# Patient Record
Sex: Female | Born: 1955 | Race: White | Hispanic: No | Marital: Single | Smoking: Former smoker
Health system: Southern US, Community
[De-identification: ages and names within clinical notes are randomized; demographics above are authoritative.]

## PROBLEM LIST (undated history)

## (undated) DIAGNOSIS — G620 Drug-induced polyneuropathy: Secondary | ICD-10-CM

## (undated) DIAGNOSIS — E079 Disorder of thyroid, unspecified: Secondary | ICD-10-CM

## (undated) DIAGNOSIS — K5792 Diverticulitis of intestine, part unspecified, without perforation or abscess without bleeding: Secondary | ICD-10-CM

## (undated) DIAGNOSIS — E039 Hypothyroidism, unspecified: Secondary | ICD-10-CM

## (undated) DIAGNOSIS — C801 Malignant (primary) neoplasm, unspecified: Secondary | ICD-10-CM

## (undated) DIAGNOSIS — C50919 Malignant neoplasm of unspecified site of unspecified female breast: Secondary | ICD-10-CM

## (undated) DIAGNOSIS — A879 Viral meningitis, unspecified: Secondary | ICD-10-CM

## (undated) DIAGNOSIS — K760 Fatty (change of) liver, not elsewhere classified: Secondary | ICD-10-CM

## (undated) DIAGNOSIS — G4730 Sleep apnea, unspecified: Secondary | ICD-10-CM

## (undated) DIAGNOSIS — E785 Hyperlipidemia, unspecified: Secondary | ICD-10-CM

## (undated) DIAGNOSIS — T451X5A Adverse effect of antineoplastic and immunosuppressive drugs, initial encounter: Secondary | ICD-10-CM

## (undated) HISTORY — PX: APPENDECTOMY: SHX54

## (undated) HISTORY — PX: ABDOMINAL HYSTERECTOMY: SHX81

## (undated) HISTORY — PX: NASAL SEPTUM SURGERY: SHX37

---

## 2015-02-24 ENCOUNTER — Emergency Department (HOSPITAL_COMMUNITY): Payer: 59

## 2015-02-24 ENCOUNTER — Emergency Department (HOSPITAL_COMMUNITY)
Admission: EM | Admit: 2015-02-24 | Discharge: 2015-02-24 | Disposition: A | Discharge status: Home / Self Care | Payer: 59 | Attending: Emergency Medicine | Admitting: Emergency Medicine

## 2015-02-24 ENCOUNTER — Encounter (HOSPITAL_COMMUNITY): Payer: Self-pay | Admitting: Emergency Medicine

## 2015-02-24 DIAGNOSIS — Z9049 Acquired absence of other specified parts of digestive tract: Secondary | ICD-10-CM | POA: Diagnosis not present

## 2015-02-24 DIAGNOSIS — Z853 Personal history of malignant neoplasm of breast: Secondary | ICD-10-CM | POA: Insufficient documentation

## 2015-02-24 DIAGNOSIS — Z8669 Personal history of other diseases of the nervous system and sense organs: Secondary | ICD-10-CM | POA: Diagnosis not present

## 2015-02-24 DIAGNOSIS — R110 Nausea: Secondary | ICD-10-CM

## 2015-02-24 DIAGNOSIS — Z87891 Personal history of nicotine dependence: Secondary | ICD-10-CM | POA: Diagnosis not present

## 2015-02-24 DIAGNOSIS — Z8661 Personal history of infections of the central nervous system: Secondary | ICD-10-CM | POA: Diagnosis not present

## 2015-02-24 DIAGNOSIS — K5792 Diverticulitis of intestine, part unspecified, without perforation or abscess without bleeding: Secondary | ICD-10-CM | POA: Diagnosis not present

## 2015-02-24 DIAGNOSIS — R740 Nonspecific elevation of levels of transaminase and lactic acid dehydrogenase [LDH]: Secondary | ICD-10-CM | POA: Insufficient documentation

## 2015-02-24 DIAGNOSIS — Z90710 Acquired absence of both cervix and uterus: Secondary | ICD-10-CM | POA: Diagnosis not present

## 2015-02-24 DIAGNOSIS — Z8639 Personal history of other endocrine, nutritional and metabolic disease: Secondary | ICD-10-CM | POA: Diagnosis not present

## 2015-02-24 DIAGNOSIS — R140 Abdominal distension (gaseous): Secondary | ICD-10-CM

## 2015-02-24 DIAGNOSIS — I890 Lymphedema, not elsewhere classified: Secondary | ICD-10-CM | POA: Insufficient documentation

## 2015-02-24 DIAGNOSIS — R1032 Left lower quadrant pain: Secondary | ICD-10-CM | POA: Diagnosis present

## 2015-02-24 DIAGNOSIS — R109 Unspecified abdominal pain: Secondary | ICD-10-CM

## 2015-02-24 DIAGNOSIS — R7401 Elevation of levels of liver transaminase levels: Secondary | ICD-10-CM

## 2015-02-24 HISTORY — DX: Malignant neoplasm of unspecified site of unspecified female breast: C50.919

## 2015-02-24 HISTORY — DX: Disorder of thyroid, unspecified: E07.9

## 2015-02-24 HISTORY — DX: Sleep apnea, unspecified: G47.30

## 2015-02-24 HISTORY — DX: Diverticulitis of intestine, part unspecified, without perforation or abscess without bleeding: K57.92

## 2015-02-24 HISTORY — DX: Adverse effect of antineoplastic and immunosuppressive drugs, initial encounter: T45.1X5A

## 2015-02-24 HISTORY — DX: Hypothyroidism, unspecified: E03.9

## 2015-02-24 HISTORY — DX: Malignant (primary) neoplasm, unspecified: C80.1

## 2015-02-24 HISTORY — DX: Fatty (change of) liver, not elsewhere classified: K76.0

## 2015-02-24 HISTORY — DX: Drug-induced polyneuropathy: G62.0

## 2015-02-24 HISTORY — DX: Viral meningitis, unspecified: A87.9

## 2015-02-24 HISTORY — DX: Hyperlipidemia, unspecified: E78.5

## 2015-02-24 LAB — URINALYSIS, ROUTINE W REFLEX MICROSCOPIC
Bilirubin Urine: NEGATIVE
GLUCOSE, UA: NEGATIVE mg/dL
HGB URINE DIPSTICK: NEGATIVE
Ketones, ur: NEGATIVE mg/dL
Leukocytes, UA: NEGATIVE
NITRITE: NEGATIVE
Protein, ur: NEGATIVE mg/dL
SPECIFIC GRAVITY, URINE: 1.012 (ref 1.005–1.030)
Urobilinogen, UA: 2 mg/dL — ABNORMAL HIGH (ref 0.0–1.0)
pH: 6 (ref 5.0–8.0)

## 2015-02-24 LAB — COMPREHENSIVE METABOLIC PANEL
ALT: 87 U/L — ABNORMAL HIGH (ref 14–54)
ANION GAP: 11 (ref 5–15)
AST: 52 U/L — AB (ref 15–41)
Albumin: 3.6 g/dL (ref 3.5–5.0)
Alkaline Phosphatase: 71 U/L (ref 38–126)
BUN: 7 mg/dL (ref 6–20)
CO2: 25 mmol/L (ref 22–32)
Calcium: 9.1 mg/dL (ref 8.9–10.3)
Chloride: 102 mmol/L (ref 101–111)
Creatinine, Ser: 0.62 mg/dL (ref 0.44–1.00)
GFR calc non Af Amer: >60 mL/min (ref >60)
Glucose, Bld: 120 mg/dL — ABNORMAL HIGH (ref 65–99)
Potassium: 3.4 mmol/L — ABNORMAL LOW (ref 3.5–5.1)
SODIUM: 138 mmol/L (ref 135–145)
Total Bilirubin: 1 mg/dL (ref 0.3–1.2)
Total Protein: 6.8 g/dL (ref 6.5–8.1)

## 2015-02-24 LAB — CBC WITH DIFFERENTIAL/PLATELET
BASOS PCT: 0 % (ref 0–1)
Basophils Absolute: 0 10*3/uL (ref 0.0–0.1)
Eosinophils Absolute: 0.2 10*3/uL (ref 0.0–0.7)
Eosinophils Relative: 2 % (ref 0–5)
HEMATOCRIT: 37.6 % (ref 36.0–46.0)
Hemoglobin: 12.8 g/dL (ref 12.0–15.0)
Lymphocytes Relative: 16 % (ref 12–46)
Lymphs Abs: 2 10*3/uL (ref 0.7–4.0)
MCH: 30.8 pg (ref 26.0–34.0)
MCHC: 34 g/dL (ref 30.0–36.0)
MCV: 90.6 fL (ref 78.0–100.0)
MONO ABS: 1.2 10*3/uL — AB (ref 0.1–1.0)
Monocytes Relative: 9 % (ref 3–12)
Neutro Abs: 9.4 10*3/uL — ABNORMAL HIGH (ref 1.7–7.7)
Neutrophils Relative %: 73 % (ref 43–77)
PLATELETS: 256 10*3/uL (ref 150–400)
RBC: 4.15 MIL/uL (ref 3.87–5.11)
RDW: 13.8 % (ref 11.5–15.5)
WBC: 12.8 10*3/uL — ABNORMAL HIGH (ref 4.0–10.5)

## 2015-02-24 LAB — LIPASE, BLOOD: Lipase: 25 U/L (ref 22–51)

## 2015-02-24 MED ORDER — IOHEXOL 300 MG/ML  SOLN
100.0000 mL | Freq: Once | INTRAMUSCULAR | Status: AC | PRN
  Administered 2015-02-24: 100 mL via INTRAVENOUS

## 2015-02-24 MED ORDER — IOHEXOL 300 MG/ML  SOLN
25.0000 mL | Freq: Once | INTRAMUSCULAR | Status: AC | PRN
  Administered 2015-02-24: 25 mL via ORAL

## 2015-02-24 MED ORDER — ONDANSETRON HCL 8 MG PO TABS: 8.0000 mg | ORAL_TABLET | Freq: Three times a day (TID) | ORAL | Status: AC | PRN

## 2015-02-24 MED ORDER — HEPARIN SOD (PORK) LOCK FLUSH 100 UNIT/ML IV SOLN
500.0000 [IU] | INTRAVENOUS | Status: AC | PRN
  Administered 2015-02-24: 500 [IU]

## 2015-02-24 MED ORDER — AMOXICILLIN-POT CLAVULANATE 875-125 MG PO TABS: 1.0000 | ORAL_TABLET | Freq: Two times a day (BID) | ORAL | Status: AC

## 2015-02-24 MED ORDER — SODIUM CHLORIDE 0.9 % IV BOLUS (SEPSIS)
1000.0000 mL | Freq: Once | INTRAVENOUS | Status: AC
Start: 2015-02-24 — End: 2015-02-24
  Administered 2015-02-24: 1000 mL via INTRAVENOUS

## 2015-02-24 MED ORDER — MORPHINE SULFATE 4 MG/ML IJ SOLN
4.0000 mg | Freq: Once | INTRAMUSCULAR | Status: DC
  Filled 2015-02-24: qty 1

## 2015-02-24 MED ORDER — HYDROCODONE-ACETAMINOPHEN 5-325 MG PO TABS: 1.0000 | ORAL_TABLET | Freq: Four times a day (QID) | ORAL | Status: AC | PRN

## 2015-02-24 NOTE — ED Notes (Signed)
Pt from hotel with c/o lower left sharp abdominal pain that woke her from sleep at about 1130 Friday night.  Pt reports soft stools, no bleeding noted in BM, reports she's from out of town has had a changed in diet and believes she is having diverticulitis.  Pt drinking Mt Dew stating is soothes the pain.  Hx of same, cipro to treat, unable to take flagyl.  Pt in NAD, A&O.

## 2015-02-24 NOTE — ED Notes (Signed)
Pt had port place in [place].  IV team paged

## 2015-02-24 NOTE — Discharge Instructions (Signed)
Your abdominal pain is due to diverticulitis. Take augmentin as directed. Use tylenol or motrin as needed for pain, and use norco as directed as needed for severe pain. Use zofran as directed for nausea. Stay well hydrated with clear liquids, eat a bland diet of clear liquids for 2-3 days then progress diet to soft bland foods. Follow up with your regular doctor in 1 week. Return to the ER for changes or worsening symptoms.  Abdominal (belly) pain can be caused by many things. Your caregiver performed an examination and possibly ordered blood/urine tests and imaging (CT scan, x-rays, ultrasound). Many cases can be observed and treated at home after initial evaluation in the emergency department. Even though you are being discharged home, abdominal pain can be unpredictable. Therefore, you need a repeated exam if your pain does not resolve, returns, or worsens. Most patients with abdominal pain don't have to be admitted to the hospital or have surgery, but serious problems like appendicitis and gallbladder attacks can start out as nonspecific pain. Many abdominal conditions cannot be diagnosed in one visit, so follow-up evaluations are very important. SEEK IMMEDIATE MEDICAL ATTENTION IF YOU DEVELOP ANY OF THE FOLLOWING SYMPTOMS:  The pain does not go away or becomes severe.   A temperature above 101 develops.   Repeated vomiting occurs (multiple episodes).   The pain becomes localized to portions of the abdomen. The right side could possibly be appendicitis. In an adult, the left lower portion of the abdomen could be colitis or diverticulitis.   Blood is being passed in stools or vomit (bright red or black tarry stools).   Return also if you develop chest pain, difficulty breathing, dizziness or fainting, or become confused, poorly responsive, or inconsolable (young children).  The constipation stays for more than 4 days.   There is belly (abdominal) or rectal pain.   You do not seem to be  getting better.     Abdominal Pain, Women Abdominal (stomach, pelvic, or belly) pain can be caused by many things. It is important to tell your doctor:  The location of the pain.  Does it come and go or is it present all the time?  Are there things that start the pain (eating certain foods, exercise)?  Are there other symptoms associated with the pain (fever, nausea, vomiting, diarrhea)? All of this is helpful to know when trying to find the cause of the pain. CAUSES   Stomach: virus or bacteria infection, or ulcer.  Intestine: appendicitis (inflamed appendix), regional ileitis (Crohn's disease), ulcerative colitis (inflamed colon), irritable bowel syndrome, diverticulitis (inflamed diverticulum of the colon), or cancer of the stomach or intestine.  Gallbladder disease or stones in the gallbladder.  Kidney disease, kidney stones, or infection.  Pancreas infection or cancer.  Fibromyalgia (pain disorder).  Diseases of the female organs:  Uterus: fibroid (non-cancerous) tumors or infection.  Fallopian tubes: infection or tubal pregnancy.  Ovary: cysts or tumors.  Pelvic adhesions (scar tissue).  Endometriosis (uterus lining tissue growing in the pelvis and on the pelvic organs).  Pelvic congestion syndrome (female organs filling up with blood just before the menstrual period).  Pain with the menstrual period.  Pain with ovulation (producing an egg).  Pain with an IUD (intrauterine device, birth control) in the uterus.  Cancer of the female organs.  Functional pain (pain not caused by a disease, may improve without treatment).  Psychological pain.  Depression. DIAGNOSIS  Your doctor will decide the seriousness of your pain by doing an examination.  Blood tests.  X-rays.  Ultrasound.  CT scan (computed tomography, special type of X-ray).  MRI (magnetic resonance imaging).  Cultures, for infection.  Barium enema (dye inserted in the large intestine, to  better view it with X-rays).  Colonoscopy (looking in intestine with a lighted tube).  Laparoscopy (minor surgery, looking in abdomen with a lighted tube).  Major abdominal exploratory surgery (looking in abdomen with a large incision). TREATMENT  The treatment will depend on the cause of the pain.   Many cases can be observed and treated at home.  Over-the-counter medicines recommended by your caregiver.  Prescription medicine.  Antibiotics, for infection.  Birth control pills, for painful periods or for ovulation pain.  Hormone treatment, for endometriosis.  Nerve blocking injections.  Physical therapy.  Antidepressants.  Counseling with a psychologist or psychiatrist.  Minor or major surgery. HOME CARE INSTRUCTIONS   Do not take laxatives, unless directed by your caregiver.  Take over-the-counter pain medicine only if ordered by your caregiver. Do not take aspirin because it can cause an upset stomach or bleeding.  Try a clear liquid diet (broth or water) as ordered by your caregiver. Slowly move to a bland diet, as tolerated, if the pain is related to the stomach or intestine.  Have a thermometer and take your temperature several times a day, and record it.  Bed rest and sleep, if it helps the pain.  Avoid sexual intercourse, if it causes pain.  Avoid stressful situations.  Keep your follow-up appointments and tests, as your caregiver orders.  If the pain does not go away with medicine or surgery, you may try:  Acupuncture.  Relaxation exercises (yoga, meditation).  Group therapy.  Counseling. SEEK MEDICAL CARE IF:   You notice certain foods cause stomach pain.  Your home care treatment is not helping your pain.  You need stronger pain medicine.  You want your IUD removed.  You feel faint or lightheaded.  You develop nausea and vomiting.  You develop a rash.  You are having side effects or an allergy to your medicine. SEEK IMMEDIATE  MEDICAL CARE IF:   Your pain does not go away or gets worse.  You have a fever.  Your pain is felt only in portions of the abdomen. The right side could possibly be appendicitis. The left lower portion of the abdomen could be colitis or diverticulitis.  You are passing blood in your stools (bright red or black tarry stools, with or without vomiting).  You have blood in your urine.  You develop chills, with or without a fever.  You pass out. MAKE SURE YOU:   Understand these instructions.  Will watch your condition.  Will get help right away if you are not doing well or get worse. Document Released: 06/28/2007 Document Revised: 01/15/2014 Document Reviewed: 07/18/2009 [facility] Patient Information 2015 [place], [place]. This information is not intended to replace advice given to you by your health care provider. Make sure you discuss any questions you have with your health care provider.  Diverticulitis Diverticulitis is when small pockets that have formed in your colon (large intestine) become infected or swollen. HOME CARE  Follow your doctor's instructions.  Follow a special diet if told by your doctor.  When you feel better, your doctor may tell you to change your diet. You may be told to eat a lot of fiber. Fruits and vegetables are good sources of fiber. Fiber makes it easier to poop (have bowel movements).  Take supplements or probiotics as  told by your doctor.  Only take medicines as told by your doctor.  Keep all follow-up visits with your doctor. GET HELP IF:  Your pain does not get better.  You have a hard time eating food.  You are not pooping like normal. GET HELP RIGHT AWAY IF:  Your pain gets worse.  Your problems do not get better.  Your problems suddenly get worse.  You have a fever.  You keep throwing up (vomiting).  You have bloody or black, tarry poop (stool). MAKE SURE YOU:   Understand these instructions.  Will watch your  condition.  Will get help right away if you are not doing well or get worse. Document Released: 02/17/2008 Document Revised: 09/05/2013 Document Reviewed: 07/26/2013 [facility] Patient Information 2015 [place], [place]. This information is not intended to replace advice given to you by your health care provider. Make sure you discuss any questions you have with your health care provider.  Nausea, Adult Nausea is the feeling that you have an upset stomach or have to vomit. Nausea by itself is not likely a serious concern, but it may be an early sign of more serious medical problems. As nausea gets worse, it can lead to vomiting. If vomiting develops, there is the risk of dehydration.  CAUSES   Viral infections.  Food poisoning.  Medicines.  Pregnancy.  Motion sickness.  Migraine headaches.  Emotional distress.  Severe pain from any source.  Alcohol intoxication. HOME CARE INSTRUCTIONS  Get plenty of rest.  Ask your caregiver about specific rehydration instructions.  Eat small amounts of food and sip liquids more often.  Take all medicines as told by your caregiver. SEEK MEDICAL CARE IF:  You have not improved after 2 days, or you get worse.  You have a headache. SEEK IMMEDIATE MEDICAL CARE IF:   You have a fever.  You faint.  You keep vomiting or have blood in your vomit.  You are extremely weak or dehydrated.  You have dark or bloody stools.  You have severe chest or abdominal pain. MAKE SURE YOU:  Understand these instructions.  Will watch your condition.  Will get help right away if you are not doing well or get worse. Document Released: 10/08/2004 Document Revised: 05/25/2012 Document Reviewed: 05/13/2011 [facility] Patient Information 2015 [place], [place]. This information is not intended to replace advice given to you by your health care provider. Make sure you discuss any questions you have with your health care provider.

## 2015-02-24 NOTE — ED Provider Notes (Signed)
CSN: [id number]     Arrival date & time 02/24/15  7062 History   First MD Initiated Contact with Patient 02/24/15 0847     Chief Complaint  Patient presents with  . Abdominal Pain     (Consider location/radiation/quality/duration/timing/severity/associated sxs/prior Treatment) HPI Comments: Melissa Richard is a 59 y.o. 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 She states this pain is 9/10 sharp and stabbing, located in the left lower quadrant, intermittent with movement, nonradiating, worse with movement, and improved with laying on her left side. Associated symptoms include bloating, nausea initially with pain but now resolved, and inability to pass flatus today. Her last flatus was yesterday night. She denies any fevers, chills, chest pain, shortness of breath, vomiting, diarrhea, constipation, melanotic, hematochezia, dysuria, hematuria, vaginal bleeding or discharge, numbness, tingling, weakness, foreign travel, suspicious food intake, NSAID use, sick contacts, or recent antibiotics. She states she is on a business trip from [place], and has been "eating differently" since being here and she wonders whether this may have caused a flare of her diverticulitis. She drinks 4-5 drinks per week, and had 2 beers on Friday prior to onset of symptoms. She is not actively undergoing chemo, and is in remission from her breast CA. She states she had a CT in February which was "normal" aside from showing diverticulosis. She can tolerate cipro but is unable to take flagyl.  Patient is a 58 y.o. female presenting with  abdominal pain. The history is provided by the patient. No language interpreter was used.  Abdominal Pain Pain location:  LLQ Pain quality: sharp   Pain radiates to:  Does not radiate Pain severity:  Moderate Onset quality:  Sudden Duration:  36 hours Timing:  Intermittent Progression:  Waxing and waning Chronicity:  Recurrent Context: alcohol use (2 beers Fri night), diet changes and recent travel (from [place])   Context: not sick contacts and not suspicious food intake   Relieved by:  Position changes Worsened by:  Movement Ineffective treatments:  None tried Associated symptoms: flatus (unable to pass flatus today) and nausea (initially, but now resolved)   Associated symptoms: no chest pain, no chills, no constipation, no diarrhea, no dysuria, no fever, no hematemesis, no hematochezia, no hematuria, no melena, no shortness of breath, no vaginal bleeding, no vaginal discharge and no vomiting   Risk factors: multiple surgeries and obesity     No past medical history on file. No past surgical history on file. No family history on file. History  Substance Use Topics  . Smoking status: Not on file  . Smokeless tobacco: Not on file  . Alcohol Use: Not on file   OB History    No data available     Review of Systems  Constitutional: Negative for fever and chills.  Respiratory: Negative for shortness of breath.   Cardiovascular: Negative for chest pain.  Gastrointestinal: Positive for nausea (initially, but now resolved), abdominal pain and flatus (unable to pass flatus today). Negative for vomiting, diarrhea, constipation, blood in stool, melena, hematochezia and hematemesis.  Genitourinary: Negative for dysuria, hematuria, flank pain, vaginal bleeding and vaginal discharge.  Musculoskeletal: Negative for myalgias and arthralgias.  Skin: Negative for color change.  Allergic/Immunologic: Negative for immunocompromised state.  Neurological: Negative for weakness and numbness.    Psychiatric/Behavioral: Negative for confusion.   10 Systems reviewed and are negative for acute change except as noted in the HPI.    Allergies  Review of patient's allergies indicates not on file.  Home Medications   Prior to Admission medications   Not on File   BP 136/64 mmHg  Pulse 90  Temp(Src) 98.2 F (36.8 C) (Oral)  Resp 17  Ht 5\' 8"  (1.727 m)  Wt 255 lb (115.667 kg)  BMI 38.78 kg/m2  SpO2 97% Physical Exam  Constitutional: She is oriented to person, place, and time. Vital signs are normal. She appears well-developed and well-nourished.  Non-toxic appearance. No distress.  Afebrile, nontoxic, NAD  HENT:  Head: Normocephalic and atraumatic.  Mouth/Throat: Oropharynx is clear and moist and mucous membranes are normal.  Eyes: Conjunctivae and EOM are normal. Right eye exhibits no discharge. Left eye exhibits no discharge.  Neck: Normal range of motion. Neck supple.  Cardiovascular: Normal rate, regular rhythm, normal heart sounds and intact distal pulses.  Exam reveals no gallop and no friction rub.   No murmur heard. Pulmonary/Chest: Effort normal and breath sounds normal. No respiratory distress. She has no decreased breath sounds. She has no wheezes. She has no rhonchi. She has no rales.  R upper chest port without surrounding erythema  Abdominal: Soft. Normal appearance and bowel sounds are normal. She exhibits no distension. There is tenderness in the left lower quadrant. There is no rigidity, no rebound, no guarding, no CVA tenderness, no tenderness at McBurney's point and negative Murphy's sign.    Soft, obese but not overtly distended, +BS throughout, with LLQ TTP, no r/g/r, neg murphy's, neg mcburney's, no CVA TTP   Musculoskeletal: Normal range of motion.  Neurological: She is alert and oriented to person, place, and time. She has normal strength. No sensory deficit.  Skin: Skin is warm, dry and intact. No rash noted.  Psychiatric: She has a normal mood and  affect.  Nursing note and vitals reviewed.   ED Course  Procedures (including critical care time) Labs Review Labs Reviewed  CBC WITH DIFFERENTIAL/PLATELET - Abnormal; Notable for the following:    WBC 12.8 (*)    Neutro Abs 9.4 (*)    Monocytes Absolute 1.2 (*)    All other components within normal limits  COMPREHENSIVE METABOLIC PANEL - Abnormal; Notable for the following:    Potassium 3.4 (*)    Glucose, Bld 120 (*)    AST 52 (*)    ALT 87 (*)    All other components within normal limits  URINALYSIS, ROUTINE W REFLEX MICROSCOPIC (NOT AT [facility]) - Abnormal; Notable for the following:    APPearance CLOUDY (*)    Urobilinogen, UA 2.0 (*)    All other components within normal limits  LIPASE, BLOOD    Imaging Review Ct Abdomen Pelvis W Contrast  02/24/2015   CLINICAL DATA:  Left-sided abdominal pain  EXAM: CT ABDOMEN AND PELVIS WITH CONTRAST  TECHNIQUE: Multidetector CT imaging of the abdomen and pelvis was performed using the standard protocol following bolus administration of intravenous contrast.  CONTRAST:  100 mL Omnipaque 300  COMPARISON:  None.  FINDINGS: Lung bases are free of acute infiltrate or sizable effusion. Calcified granuloma is noted in the left lower lobe. Bilateral breast implants are noted.  The liver is diffusely fatty infiltrated. A lobulated area of decreased attenuation is noted  likely representing a cyst. The spleen demonstrates multiple granulomas. A knee right adrenal lesion is noted measuring approximately 1 cm likely representing small adenoma. The pancreas is within normal limits. The kidneys show no mass lesion or hydronephrosis. No renal calculi are seen.  The bladder is well distended. The uterus has been surgically removed. The appendix has been removed as well. Diverticular change of the colon is noted with evidence of diverticulitis at the junction of the descending and sigmoid colons. No abscess or perforation is seen. No acute bony abnormality is seen.   IMPRESSION: Diverticulitis.  Fatty liver with apparent cyst.  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  Prior granulomatous disease.   Electronically Signed   By: Inez Catalina M.D.   On: 02/24/2015 14:06   Dg Chest Portable 1 View  02/24/2015   CLINICAL DATA:  Port-A-Cath placement.  EXAM: PORTABLE CHEST - 1 VIEW  COMPARISON:  None.  FINDINGS: Right Port-A-Cath terminates at the low SVC. surgical clips within the left axilla and both breasts. Midline trachea. Borderline cardiomegaly. Mediastinal contours otherwise within normal limits. No pleural effusion or pneumothorax. No congestive failure. Nodular density projecting lateral to the left heart border is favored to be vascular or artifactual.  IMPRESSION: Appropriate position of right Port-A-Cath, without pneumothorax.  Cardiomegaly without congestive failure.  Nodular density projecting over the left mid lung is favored to be artifactual or possibly represent a vessel on than. Consider PA and lateral radiographs with attention to this area.   Electronically Signed   By: Abigail Miyamoto M.D.   On: 02/24/2015 11:27     EKG Interpretation None      MDM   Final diagnoses:  Abdominal pain  Abdominal bloating  Diverticulitis of intestine without perforation or abscess without bleeding  Nausea  Elevated transaminase level    59 y.o. female here with LLQ abd pain, mild nausea initially but none now. On exam, tender in LLQ, nonperitoneal, no flank pain. Pt feels bloated and hasn't passed gas since last night. Hx of cancer, therefore concern for possible obstruction as an etiology. Will obtain labs and CT abd/pelvis. Pt declines pain meds at this time since she has to drive home. Offered that if her pain became unbearable, we will arrange return ride or keep her until narcotics are out of her system. Will reassess shortly.   12:24 PM Difficulty with establishing IV access and getting labs, IV team paged and requested CXR to eval placement. CXR showing appropriate  port-a-cath placement. Also showing mild cardiomegaly and nodular density over L mid lung which is likely artifact. Awaiting labs and CT. Will continue to monitor pt.  1:17 PM CBC w/diff showing mild leukocytosis. CMP showing K 3.4, and AST/ALT elevations which is chronic per pt. Lipase WNL. U/A pending and awaiting CT. Pt now requesting pain meds, will give morphine. Will reassess shortly.   2:37 PM U/A unremarkable. CT showing diverticulitis, fatty liver, R adrenal adenoma. Will treat with augmentin for diverticulitis since pt doesn't tolerate flagyl. Will give pain meds and discussed diet of clear liquids x2-3 days then advance to soft bland foods. Will give zofran for nausea. Will have  Her f/up with PCP in 1wk. I explained the diagnosis and have given explicit precautions to return to the ER including for any other new or worsening symptoms. The patient understands and accepts the medical plan as it's been dictated and I have answered their questions. Discharge instructions concerning home care and prescriptions have been given. The patient is STABLE and  is discharged to home in good condition.  BP 143/56 mmHg  Pulse 90  Temp(Src) 98.2 F (36.8 C) (Oral)  Resp 18  Ht 5\' 8"  (1.727 m)  Wt 255 lb (115.667 kg)  BMI 38.78 kg/m2  SpO2 99%  Meds ordered this encounter  Medications  . iohexol (OMNIPAQUE) 300 MG/ML solution 25 mL    Sig:   . sodium chloride 0.9 % bolus 1,000 mL    Sig:   . morphine 4 MG/ML injection 4 mg    Sig:   . iohexol (OMNIPAQUE) 300 MG/ML solution 100 mL    Sig:   . ondansetron (ZOFRAN) 8 MG tablet    Sig: Take 1 tablet (8 mg total) by mouth every 8 (eight) hours as needed for nausea or vomiting.    Dispense:  10 tablet    Refill:  0    Order Specific Question:  Supervising Provider    Answer:  Sabra Heck, BRIAN [[id number]]  . HYDROcodone-acetaminophen (NORCO) 5-325 MG per tablet    Sig: Take 1 tablet by mouth every 6 (six) hours as needed for severe pain.    Dispense:   10 tablet    Refill:  0    Order Specific Question:  Supervising Provider    Answer:  Sabra Heck, BRIAN [[id number]]  . amoxicillin-clavulanate (AUGMENTIN) 875-125 MG per tablet    Sig: Take 1 tablet by mouth 2 (two) times daily. One po bid x 10 days    Dispense:  20 tablet    Refill:  0    Order Specific Question:  Supervising Provider    Answer:  Noemi Chapel [[id number]]     Vaughan Garfinkle Camprubi-Soms, PA-C 02/24/15 1439  Nat Christen, MD 02/24/15 1444

## 2016-07-02 IMAGING — CR DG CHEST 1V PORT
1 series · 1 of 1 positions shown · non-contrast
Comparison: None.

CLINICAL DATA: Port-A-Cath placement.

EXAM:
PORTABLE CHEST - 1 VIEW

[AP]
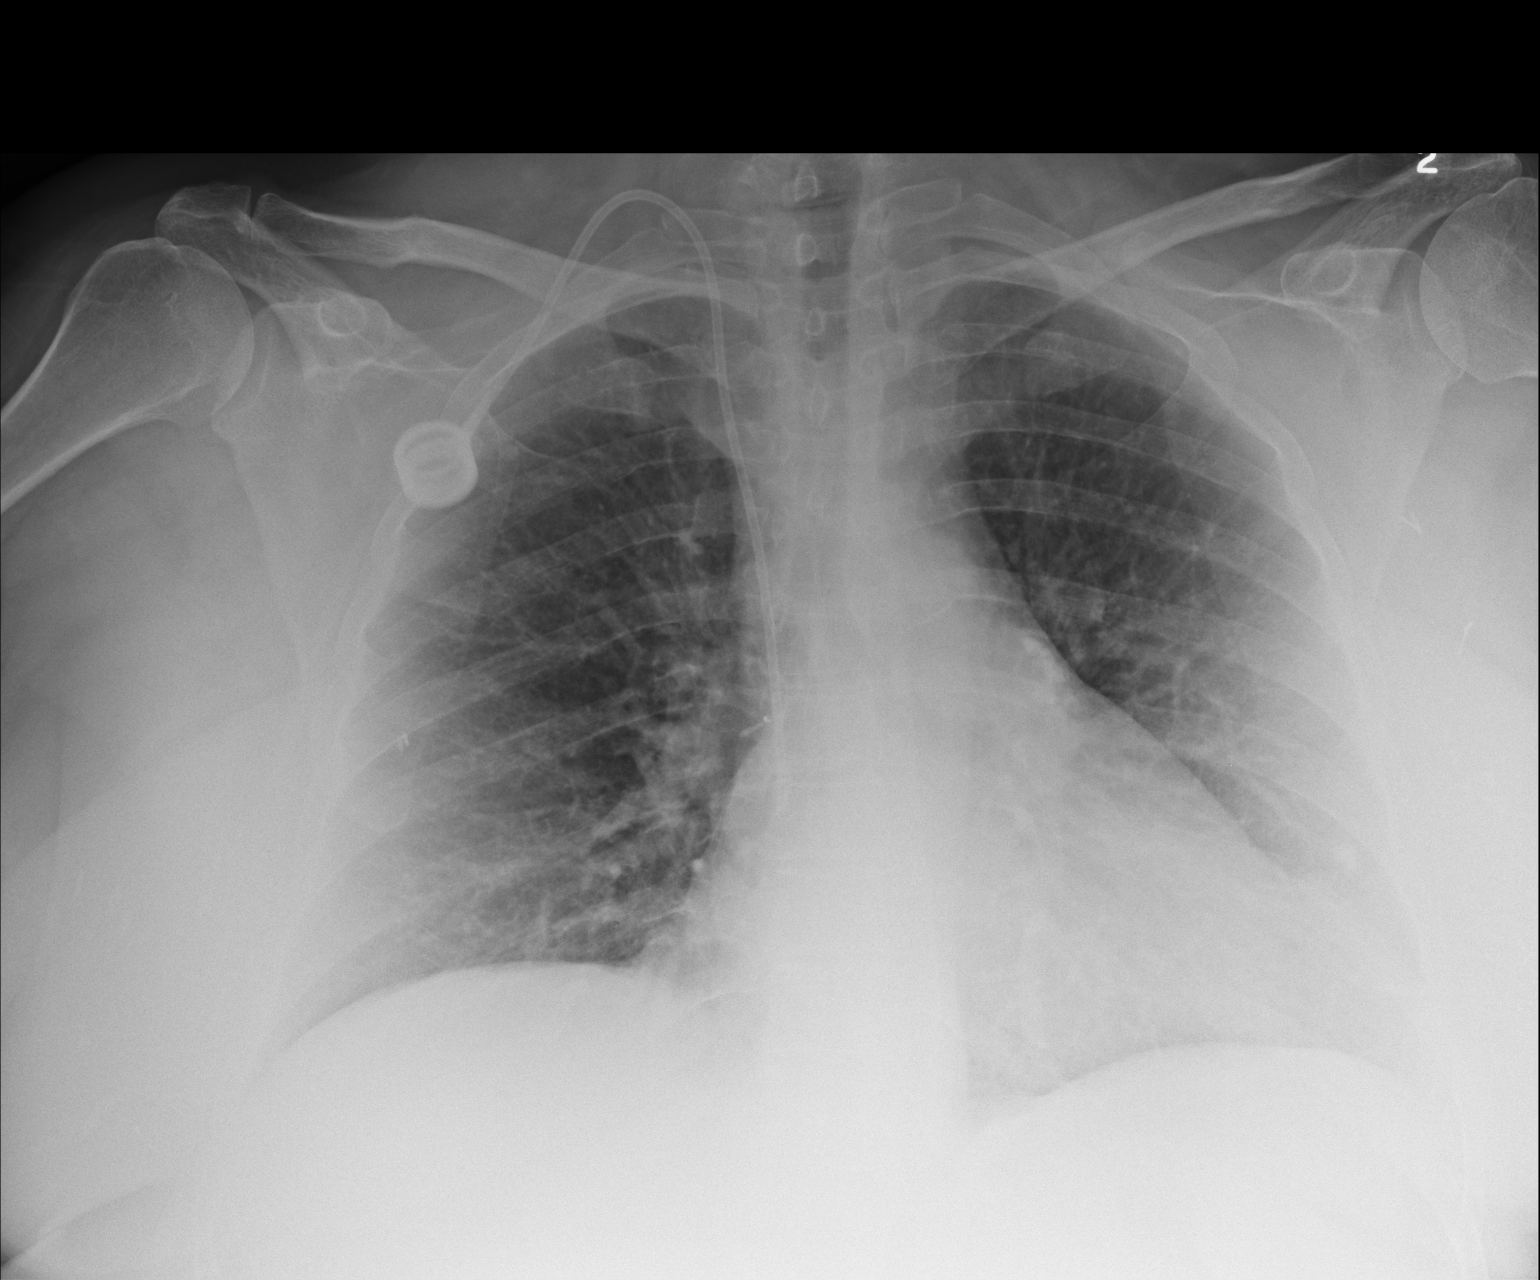

[1 of 1 positions shown; findings below may reference images not displayed]

FINDINGS: Right Port-A-Cath terminates at the low SVC. surgical clips within
the left axilla and both breasts. Midline trachea. Borderline
cardiomegaly. Mediastinal contours otherwise within normal limits.
No pleural effusion or pneumothorax. No congestive failure. Nodular
density projecting lateral to the left heart border is favored to be
vascular or artifactual.
IMPRESSION: Appropriate position of right Port-A-Cath, without pneumothorax.

Cardiomegaly without congestive failure.

Nodular density projecting over the left mid lung is favored to be
artifactual or possibly represent a vessel on than. Consider PA and
lateral radiographs with attention to this area.

## 2016-07-02 IMAGING — CT CT ABD-PELV W/ CM
2 of 5 series · 17 of 46 positions shown, 19 images · IV contrast (Omni 300)
Comparison: None.

CLINICAL DATA: Left-sided abdominal pain

EXAM:
CT ABDOMEN AND PELVIS WITH CONTRAST
TECHNIQUE: Multidetector CT imaging of the abdomen and pelvis was performed
using the standard protocol following bolus administration of
intravenous contrast.
CONTRAST:  100 mL Omnipaque 300

[Series 2: abd/ pelvis 5.0 i30f 1 · axial · 0.82mm/px · z∈[+777,+1227]mm · 14 of 102 slices shown, 16 images]
[im 6/102  soft-tissue]
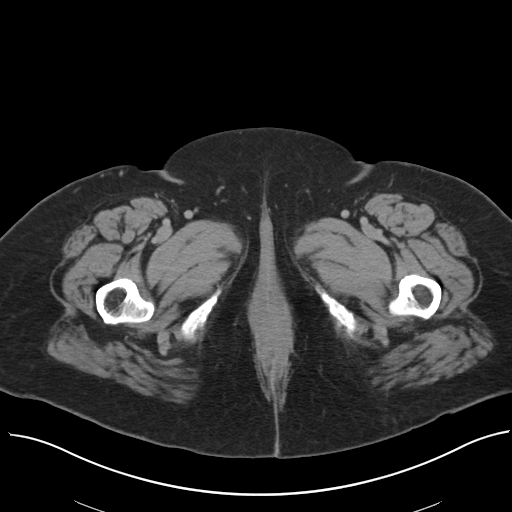
[im 6/102  bone]
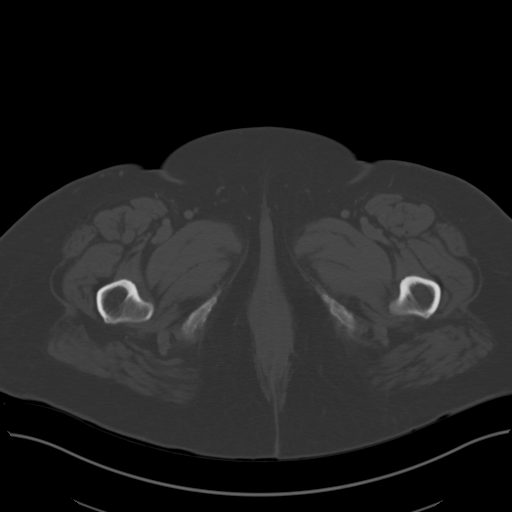
[im 11/102  soft-tissue]
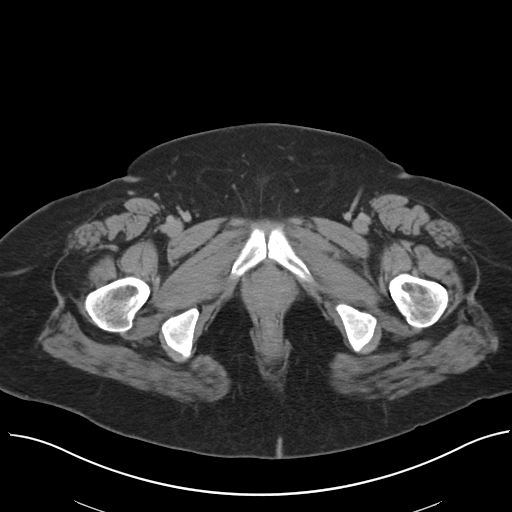
[im 22/102  soft-tissue]
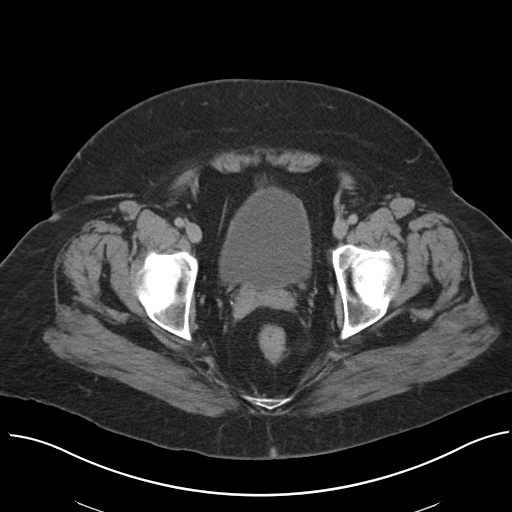
[im 27/102  soft-tissue]
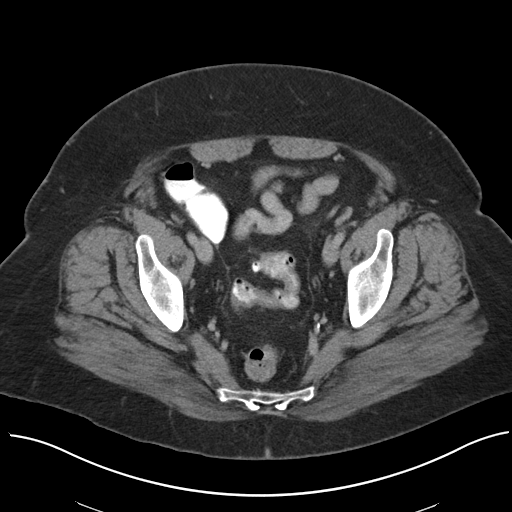
[im 32/102  soft-tissue]
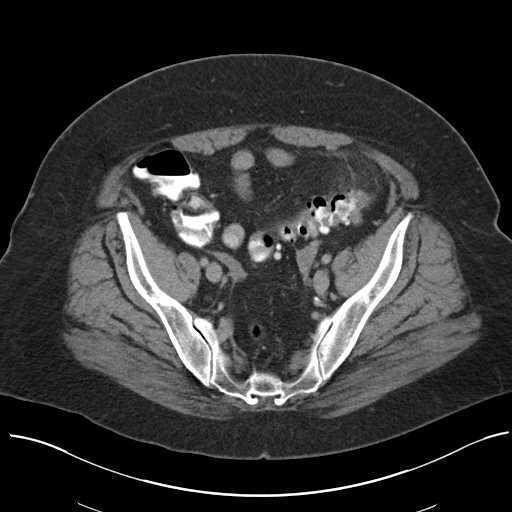
[im 43/102  soft-tissue]
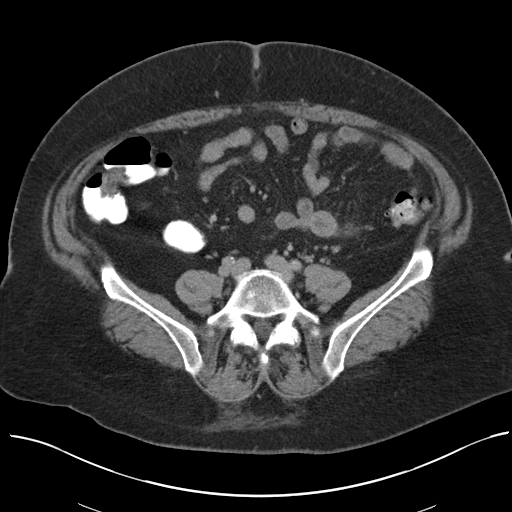
[im 48/102  soft-tissue]
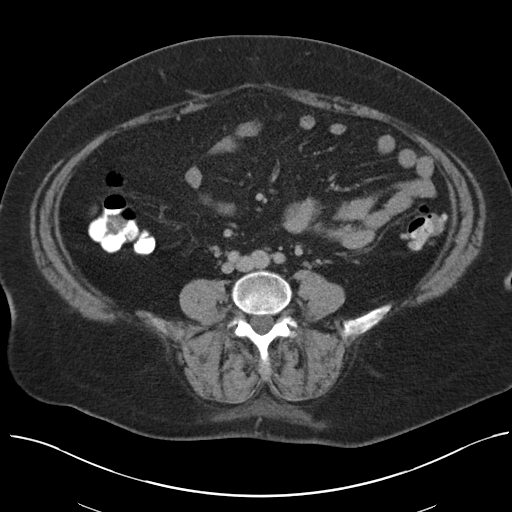
[im 54/102  soft-tissue]
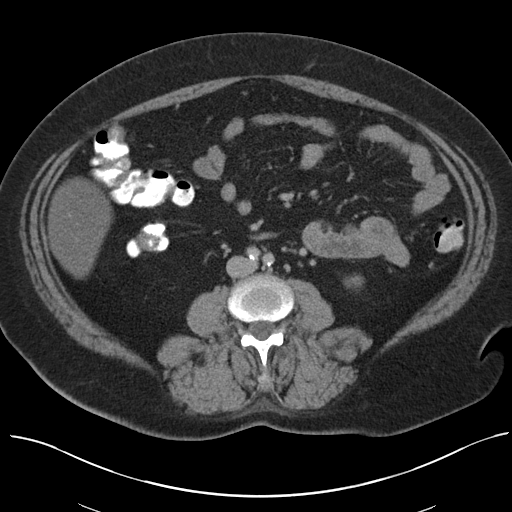
[im 59/102  soft-tissue]
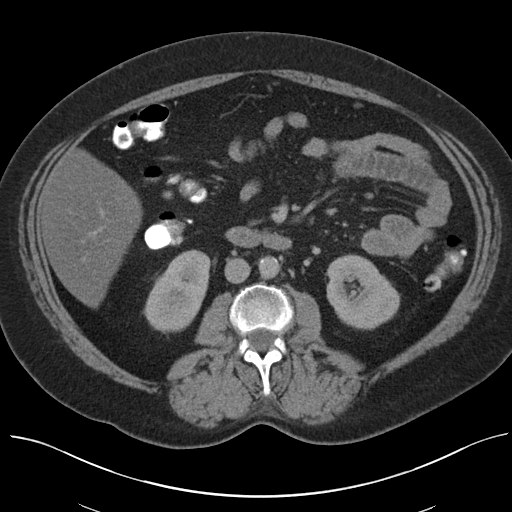
[im 59/102  bone]
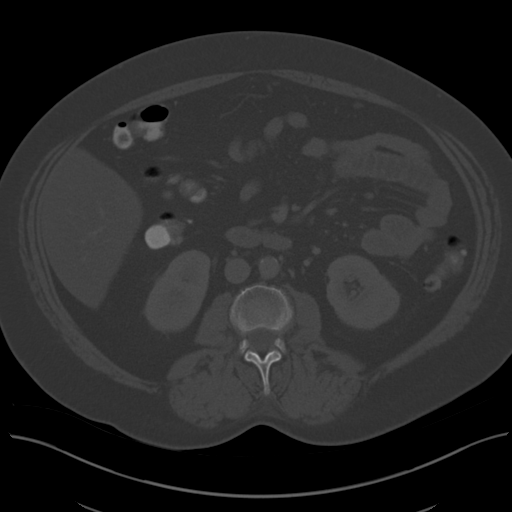
[im 70/102  soft-tissue]
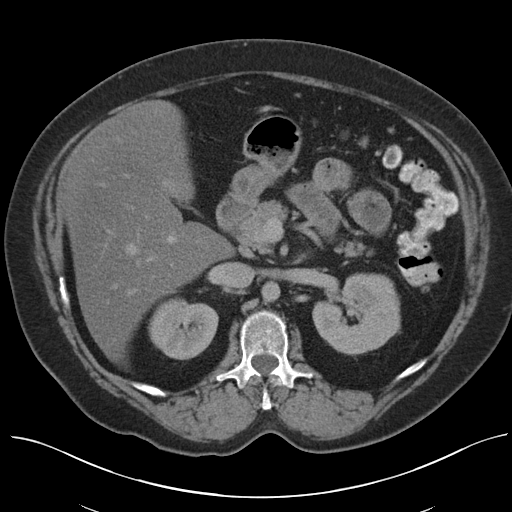
[im 75/102  soft-tissue]
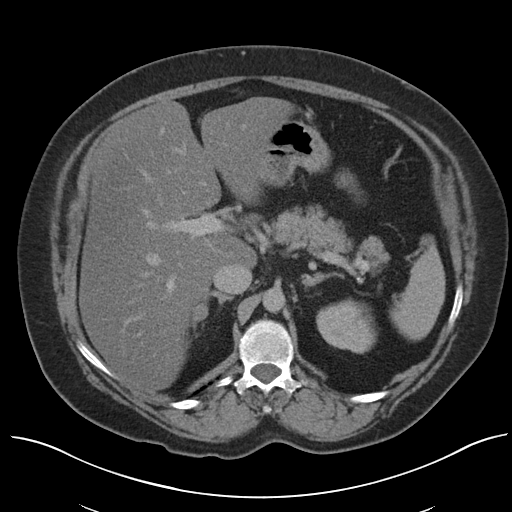
[im 80/102  soft-tissue]
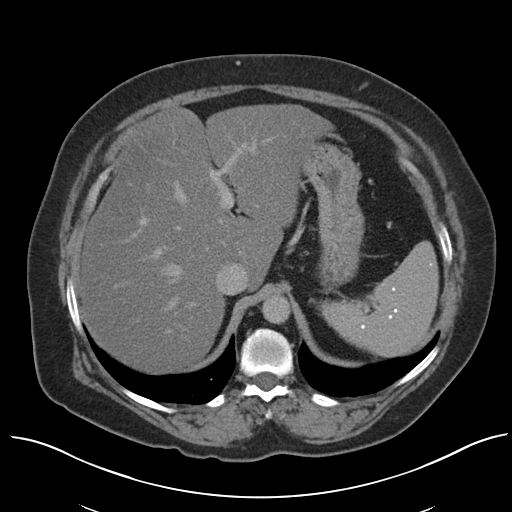
[im 91/102  soft-tissue]
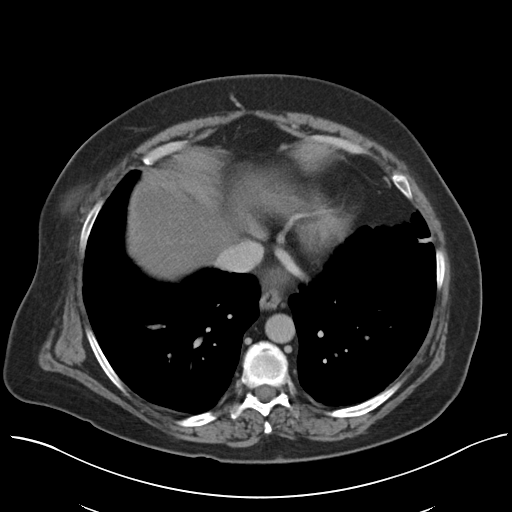
[im 96/102  soft-tissue]
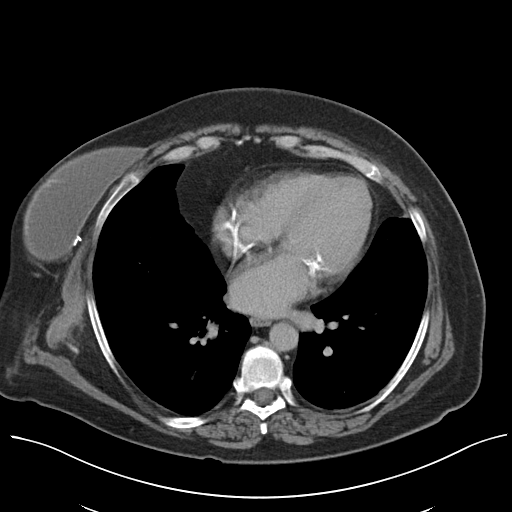

[Series 5: coronals · coronal · 0.83mm/px · 3 of 162 slices shown]
[im 54/162  soft-tissue]
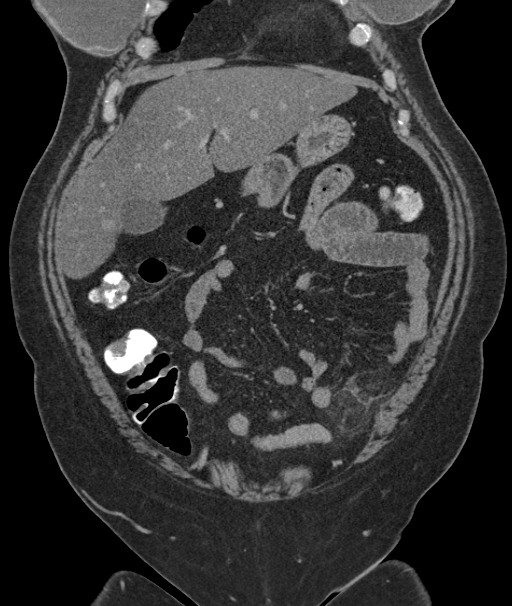
[im 72/162  soft-tissue]
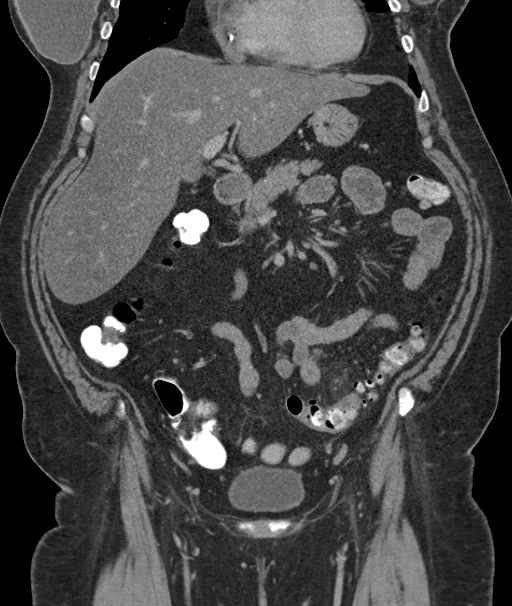
[im 90/162  soft-tissue]
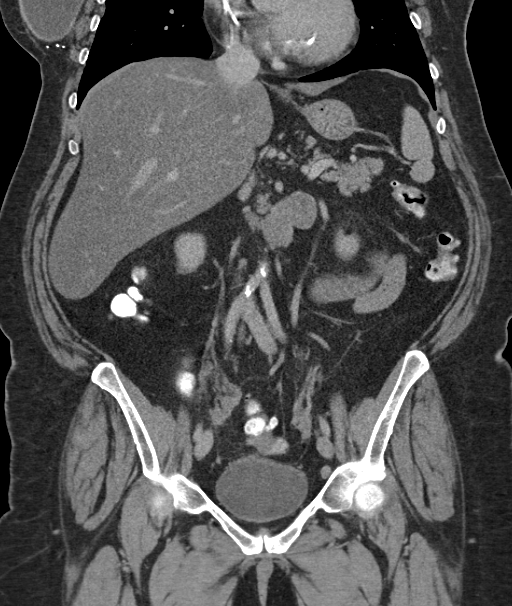

[17 of 46 positions shown; findings below may reference images not displayed]

FINDINGS: Lung bases are free of acute infiltrate or sizable effusion.
Calcified granuloma is noted in the left lower lobe. Bilateral
breast implants are noted.

The liver is diffusely fatty infiltrated. A lobulated area of
decreased attenuation is noted likely representing a cyst. The
spleen demonstrates multiple granulomas. A knee right adrenal lesion
is noted measuring approximately 1 cm likely representing small
adenoma. The pancreas is within normal limits. The kidneys show no
mass lesion or hydronephrosis. No renal calculi are seen.

The bladder is well distended. The uterus has been surgically
removed. The appendix has been removed as well. Diverticular change
of the colon is noted with evidence of diverticulitis at the
junction of the descending and sigmoid colons. No abscess or
perforation is seen. No acute bony abnormality is seen.
IMPRESSION: Diverticulitis.

Fatty liver with apparent cyst.

Likely right adrenal adenoma.

Prior granulomatous disease.
